# Patient Record
Sex: Female | Born: 1985 | Race: Black or African American | Hispanic: No | Marital: Single | Smoking: Current every day smoker
Health system: Southern US, Community
[De-identification: ages and names within clinical notes are randomized; demographics above are authoritative.]

## PROBLEM LIST (undated history)

## (undated) DIAGNOSIS — I10 Essential (primary) hypertension: Secondary | ICD-10-CM

---

## 2013-06-12 ENCOUNTER — Emergency Department (HOSPITAL_BASED_OUTPATIENT_CLINIC_OR_DEPARTMENT_OTHER)
Admission: EM | Admit: 2013-06-12 | Discharge: 2013-06-12 | Disposition: A | Discharge status: Home / Self Care | Payer: Self-pay | Attending: Emergency Medicine | Admitting: Emergency Medicine

## 2013-06-12 ENCOUNTER — Emergency Department (HOSPITAL_BASED_OUTPATIENT_CLINIC_OR_DEPARTMENT_OTHER): Payer: Self-pay

## 2013-06-12 ENCOUNTER — Encounter (HOSPITAL_BASED_OUTPATIENT_CLINIC_OR_DEPARTMENT_OTHER): Payer: Self-pay | Admitting: Emergency Medicine

## 2013-06-12 DIAGNOSIS — Z3202 Encounter for pregnancy test, result negative: Secondary | ICD-10-CM | POA: Insufficient documentation

## 2013-06-12 DIAGNOSIS — F17200 Nicotine dependence, unspecified, uncomplicated: Secondary | ICD-10-CM | POA: Insufficient documentation

## 2013-06-12 DIAGNOSIS — K2920 Alcoholic gastritis without bleeding: Secondary | ICD-10-CM | POA: Insufficient documentation

## 2013-06-12 LAB — PREGNANCY, URINE: Preg Test, Ur: NEGATIVE

## 2013-06-12 MED ORDER — GI COCKTAIL ~~LOC~~
30.0000 mL | Freq: Once | ORAL | Status: AC
  Administered 2013-06-12: 30 mL via ORAL
  Filled 2013-06-12: qty 30

## 2013-06-12 MED ORDER — SUCRALFATE 1 GM/10ML PO SUSP: 1.0000 g | Freq: Four times a day (QID) | ORAL | Status: DC

## 2013-06-12 NOTE — ED Notes (Signed)
Pt reports leaving [facility] this evening after being told ekg was normal and that it was going to be more than an hour, pt reports acute onset of substernal chest pain that started this evening and has not improved with baby aspirin and tums

## 2013-06-12 NOTE — ED Provider Notes (Signed)
CSN: [id number]     Arrival date & time 06/12/13  5784 History   First MD Initiated Contact with Patient 06/12/13 0256     Chief Complaint  Patient presents with  . Chest Pain   (Consider location/radiation/quality/duration/timing/severity/associated sxs/prior Treatment) Patient is a 27 y.o. female presenting with abdominal pain. The history is provided by the patient.  Abdominal Pain Pain location:  Epigastric Pain quality: burning   Pain severity:  Moderate Onset quality:  Sudden Timing:  Constant Progression:  Unchanged Context: alcohol use   Context comment:  Started while drinking several 40s Relieved by:  Nothing Worsened by:  Nothing tried Associated symptoms: no anorexia and no shortness of breath   Risk factors: not pregnant     History reviewed. No pertinent past medical history. History reviewed. No pertinent past surgical history. History reviewed. No pertinent family history. History  Substance Use Topics  . Smoking status: Current Every Day Smoker    Types: Cigarettes  . Smokeless tobacco: Not on file  . Alcohol Use: No   OB History   Grav Para Term Preterm Abortions TAB SAB Ect Mult Living                 Review of Systems  Respiratory: Negative for shortness of breath.   Gastrointestinal: Positive for abdominal pain. Negative for anorexia.  All other systems reviewed and are negative.    Allergies  Flagyl  Home Medications   Current Outpatient Rx  Name  Route  Sig  Dispense  Refill  . aspirin 325 MG EC tablet   Oral   Take 325 mg by mouth daily.         . calcium carbonate (TUMS - DOSED IN MG ELEMENTAL CALCIUM) 500 MG chewable tablet   Oral   Chew 1 tablet by mouth daily.          BP 135/90  Pulse 103  Temp(Src) 98.5 F (36.9 C) (Oral)  Resp 20  Ht 5\' 4"  (1.626 m)  Wt 196 lb 7 oz (89.103 kg)  BMI 33.7 kg/m2  LMP 05/13/2013 Physical Exam  Constitutional: She is oriented to person, place, and time. She appears well-developed  and well-nourished. No distress.  intoxicated  HENT:  Head: Normocephalic and atraumatic.  Mouth/Throat: Oropharynx is clear and moist.  Eyes: Conjunctivae are normal. Pupils are equal, round, and reactive to light.  Neck: Normal range of motion. Neck supple.  Cardiovascular: Normal rate, regular rhythm and intact distal pulses.   Pulmonary/Chest: Effort normal and breath sounds normal. She has no wheezes.  Abdominal: Soft. Bowel sounds are normal. There is no tenderness. There is no rebound and no guarding.  Musculoskeletal: Normal range of motion.  Neurological: She is alert and oriented to person, place, and time.  Skin: Skin is warm and dry.  Psychiatric: She has a normal mood and affect.    ED Course  Procedures (including critical care time) Labs Review Labs Reviewed  PREGNANCY, URINE   Imaging Review No results found.  MDM  No diagnosis found.  Date: 06/12/2013  Rate: 100  Rhythm: normal sinus rhythm  QRS Axis: normal  Intervals: normal  ST/T Wave abnormalities: normal  Conduction Disutrbances: none  Narrative Interpretation: unremarkable  PERC negative wells 0, no OCP no car trips.  Relieved with GI cocktail will treat for alcoholic gastritis ;    Jasmine Awe, MD 06/12/13 [phone]

## 2013-06-12 NOTE — Progress Notes (Signed)
Spoke with Dr Emmit Alexanders regarding patient prescription question.MD recommended patient use over the counter pepto bismol / zantac. Contacted patient back and updated her with MD instruction. Patient verbalized her understanding of over the counter medications.Patient reports she had no questions.Case manager contact number provided to patient.No further case manager needs.

## 2013-06-12 NOTE — ED Notes (Signed)
Specimen container given with instructions for urine collection

## 2013-06-12 NOTE — Progress Notes (Signed)
Incoming call received from [facility].Patient information verified in EPIC.Patient reports she received a prescription  for  sucrafate / carafate 1gm.10 ML- suspension and was asking  If a less expensive medication substitute could be called in.I explained to patient I would speak with the MD and call her back .

## 2018-01-26 ENCOUNTER — Emergency Department (HOSPITAL_BASED_OUTPATIENT_CLINIC_OR_DEPARTMENT_OTHER)
Admission: EM | Admit: 2018-01-26 | Discharge: 2018-01-26 | Disposition: A | Discharge status: Home / Self Care | Payer: Self-pay | Attending: Emergency Medicine | Admitting: Emergency Medicine

## 2018-01-26 ENCOUNTER — Other Ambulatory Visit: Payer: Self-pay

## 2018-01-26 ENCOUNTER — Encounter (HOSPITAL_BASED_OUTPATIENT_CLINIC_OR_DEPARTMENT_OTHER): Payer: Self-pay | Admitting: Emergency Medicine

## 2018-01-26 DIAGNOSIS — F17210 Nicotine dependence, cigarettes, uncomplicated: Secondary | ICD-10-CM | POA: Insufficient documentation

## 2018-01-26 DIAGNOSIS — Z7982 Long term (current) use of aspirin: Secondary | ICD-10-CM | POA: Insufficient documentation

## 2018-01-26 DIAGNOSIS — M545 Low back pain, unspecified: Secondary | ICD-10-CM

## 2018-01-26 DIAGNOSIS — Z79899 Other long term (current) drug therapy: Secondary | ICD-10-CM | POA: Insufficient documentation

## 2018-01-26 MED ORDER — CYCLOBENZAPRINE HCL 10 MG PO TABS: 10.0000 mg | ORAL_TABLET | Freq: Two times a day (BID) | ORAL | 0 refills | Status: DC | PRN

## 2018-01-26 MED FILL — CYCLOBENZAPRINE HCL 10 MG T: 10 | 10 days supply | Qty: 20 | Fill #0

## 2018-01-26 NOTE — ED Triage Notes (Signed)
Patient states that she has chronic back pain, but it is worse now and for the last 4 days

## 2018-01-26 NOTE — ED Provider Notes (Signed)
[facility] EMERGENCY DEPARTMENT Provider Note   CSN: [id number] Arrival date & time: 01/26/18  4098     History   Chief Complaint Chief Complaint  Patient presents with  . Back Pain    HPI Brandi Mendoza is a 32 y.o. female.   Back Pain   This is a chronic problem. The current episode started 1 to 2 hours ago. The problem occurs constantly. The problem has not changed since onset.The pain is associated with no known injury. Pain location: lower paraspinal. The pain does not radiate. The pain is moderate. The symptoms are aggravated by bending, twisting and certain positions. Pertinent negatives include no chest pain, no bowel incontinence, no dysuria, no pelvic pain and no paresis.    History reviewed. No pertinent past medical history.  There are no active problems to display for this patient.   History reviewed. No pertinent surgical history.   OB History   None      Home Medications    Prior to Admission medications   Medication Sig Start Date End Date Taking? Authorizing Provider  aspirin 325 MG EC tablet Take 325 mg by mouth daily.    [provider]  calcium carbonate (TUMS - DOSED IN MG ELEMENTAL CALCIUM) 500 MG chewable tablet Chew 1 tablet by mouth daily.    [provider]  cyclobenzaprine (FLEXERIL) 10 MG tablet Take 1 tablet (10 mg total) by mouth 2 (two) times daily as needed for muscle spasms. 01/26/18   Ramey Ketcherside, Barbara Cower, MD  sucralfate (CARAFATE) 1 GM/10ML suspension Take 10 mLs (1 g total) by mouth 4 (four) times daily. 06/12/13   Palumbo, April, MD    Family History History reviewed. No pertinent family history.  Social History Social History   Tobacco Use  . Smoking status: Current Every Day Smoker    Types: Cigarettes  . Smokeless tobacco: Never Used  Substance Use Topics  . Alcohol use: No  . Drug use: Never     Allergies   Shellfish allergy and Flagyl [metronidazole]   Review of Systems Review of Systems    Cardiovascular: Negative for chest pain.  Gastrointestinal: Negative for bowel incontinence.  Genitourinary: Negative for dysuria and pelvic pain.  Musculoskeletal: Positive for back pain.  All other systems reviewed and are negative.    Physical Exam Updated Vital Signs BP 139/89 (BP Location: Left Arm)   Pulse 76   Temp 98.1 F (36.7 C) (Oral)   Resp 18   Ht  (1.626 m)   Wt 80.3 kg (177 lb)   LMP 01/26/2018   SpO2 100%   BMI 30.38 kg/m   Physical Exam  Constitutional: She is oriented to person, place, and time. She appears well-developed and well-nourished.  HENT:  Head: Normocephalic and atraumatic.  Eyes: Conjunctivae and EOM are normal.  Neck: Normal range of motion.  Cardiovascular: Normal rate and regular rhythm.  Pulmonary/Chest: No stridor. No respiratory distress.  Abdominal: Soft. She exhibits no distension.  Neurological: She is alert and oriented to person, place, and time. No cranial nerve deficit. Coordination normal.  Skin: Skin is warm and dry.  Nursing note and vitals reviewed.    ED Treatments / Results  Labs (all labs ordered are listed, but only abnormal results are displayed) Labs Reviewed - No data to display  EKG None  Radiology No results found.  Procedures Procedures (including critical care time)  Medications Ordered in ED Medications - No data to display   Initial Impression / Assessment  and Plan / ED Course  I have reviewed the triage vital signs and the nursing notes.  Pertinent labs & imaging results that were available during my care of the patient were reviewed by me and considered in my medical decision making (see chart for details).   No red flags for back pain. Suspect musculoskeletal cause. Will suggest NSAIDs and muscle relaxers for treatment. Stable for dishcarge. Needs pcp follow up.  Final Clinical Impressions(s) / ED Diagnoses   Final diagnoses:  Acute right-sided low back pain without sciatica     ED Discharge Orders        Ordered    cyclobenzaprine (FLEXERIL) 10 MG tablet  2 times daily PRN     01/26/18 1121       Saafir Abdullah, Barbara Cower, MD 01/26/18 1130

## 2019-08-11 ENCOUNTER — Other Ambulatory Visit: Payer: Self-pay

## 2019-08-11 ENCOUNTER — Emergency Department (HOSPITAL_BASED_OUTPATIENT_CLINIC_OR_DEPARTMENT_OTHER)
Admission: EM | Admit: 2019-08-11 | Discharge: 2019-08-12 | Disposition: A | Discharge status: Home / Self Care | Payer: Self-pay | Attending: Emergency Medicine | Admitting: Emergency Medicine

## 2019-08-11 ENCOUNTER — Encounter (HOSPITAL_BASED_OUTPATIENT_CLINIC_OR_DEPARTMENT_OTHER): Payer: Self-pay | Admitting: *Deleted

## 2019-08-11 DIAGNOSIS — Z7982 Long term (current) use of aspirin: Secondary | ICD-10-CM | POA: Insufficient documentation

## 2019-08-11 DIAGNOSIS — Z3202 Encounter for pregnancy test, result negative: Secondary | ICD-10-CM | POA: Insufficient documentation

## 2019-08-11 DIAGNOSIS — F17210 Nicotine dependence, cigarettes, uncomplicated: Secondary | ICD-10-CM | POA: Insufficient documentation

## 2019-08-11 DIAGNOSIS — R531 Weakness: Secondary | ICD-10-CM | POA: Insufficient documentation

## 2019-08-11 DIAGNOSIS — Z79899 Other long term (current) drug therapy: Secondary | ICD-10-CM | POA: Insufficient documentation

## 2019-08-11 LAB — BASIC METABOLIC PANEL
Anion gap: 13 (ref 5–15)
BUN: 10 mg/dL (ref 6–20)
CO2: 25 mmol/L (ref 22–32)
Calcium: 9.1 mg/dL (ref 8.9–10.3)
Chloride: 99 mmol/L (ref 98–111)
Creatinine, Ser: 0.77 mg/dL (ref 0.44–1.00)
GFR calc Af Amer: >60 mL/min (ref >60)
GFR calc non Af Amer: >60 mL/min (ref >60)
Glucose, Bld: 90 mg/dL (ref 70–99)
Potassium: 4 mmol/L (ref 3.5–5.1)
Sodium: 137 mmol/L (ref 135–145)

## 2019-08-11 LAB — CBC WITH DIFFERENTIAL/PLATELET
Abs Immature Granulocytes: 0.01 10*3/uL (ref 0.00–0.07)
Basophils Absolute: 0.1 10*3/uL (ref 0.0–0.1)
Basophils Relative: 1 %
Eosinophils Absolute: 0 10*3/uL (ref 0.0–0.5)
Eosinophils Relative: 0 %
HCT: 43.7 % (ref 36.0–46.0)
Hemoglobin: 14.7 g/dL (ref 12.0–15.0)
Immature Granulocytes: 0 %
Lymphocytes Relative: 28 %
Lymphs Abs: 2.5 10*3/uL (ref 0.7–4.0)
MCH: 31.7 pg (ref 26.0–34.0)
MCHC: 33.6 g/dL (ref 30.0–36.0)
MCV: 94.4 fL (ref 80.0–100.0)
Monocytes Absolute: 0.7 10*3/uL (ref 0.1–1.0)
Monocytes Relative: 8 %
Neutro Abs: 5.6 10*3/uL (ref 1.7–7.7)
Neutrophils Relative %: 63 %
Platelets: 454 10*3/uL — ABNORMAL HIGH (ref 150–400)
RBC: 4.63 MIL/uL (ref 3.87–5.11)
RDW: 14.1 % (ref 11.5–15.5)
WBC: 8.9 10*3/uL (ref 4.0–10.5)
nRBC: 0 % (ref 0.0–0.2)

## 2019-08-11 LAB — URINALYSIS, MICROSCOPIC (REFLEX)

## 2019-08-11 LAB — URINALYSIS, ROUTINE W REFLEX MICROSCOPIC
Bilirubin Urine: NEGATIVE
Glucose, UA: NEGATIVE mg/dL
Hgb urine dipstick: NEGATIVE
Ketones, ur: NEGATIVE mg/dL
Leukocytes,Ua: NEGATIVE
Nitrite: NEGATIVE
Protein, ur: 30 mg/dL — AB
Specific Gravity, Urine: 1.025 (ref 1.005–1.030)
pH: 6 (ref 5.0–8.0)

## 2019-08-11 LAB — PREGNANCY, URINE: Preg Test, Ur: NEGATIVE

## 2019-08-11 NOTE — ED Triage Notes (Signed)
Pt presents with complaints of feeling more tired than normal she reports sleeping 18+ hours at a time. Pt states that she feels like her electrolytes are out of wack.

## 2019-08-11 NOTE — ED Triage Notes (Addendum)
Pt c/o fatigue x 6 days , seen at [facility] today with labs done. Pt admits to [facility] today

## 2019-08-12 NOTE — ED Provider Notes (Signed)
[place] [facility] Provider Note: Georgena Spurling, MD, FACEP  CSN: [id number] MRN: [id number] ARRIVAL: 08/11/19 at 2118 ROOM: [place]  08/12/19 12:56 AM Brandi Mendoza is a 33 y.o. female who complains of 5 days of increased fatigue and increased sleep.  She reports sleeping 18 hours a day.  She denies fever or other specific symptoms of a viral illness.  She denies nausea, vomiting or diarrhea.  She does admit to drinking alcohol yesterday.  Nothing seems to make the symptoms better or worse.  She has had no known COVID-19 exposure.   History reviewed. No pertinent past medical history.  History reviewed. No pertinent surgical history.  History reviewed. No pertinent family history.  Social History   Tobacco Use  . Smoking status: Current Every Day Smoker    Types: Cigarettes  . Smokeless tobacco: Never Used  Substance Use Topics  . Alcohol use: No  . Drug use: Never    Prior to Admission medications   Medication Sig Start Date End Date Taking? Authorizing Provider  amLODipine (NORVASC) 5 MG tablet Take by mouth. 07/01/19 08/30/19 Yes [provider]  aspirin 325 MG EC tablet Take 325 mg by mouth daily.    [provider]  calcium carbonate (TUMS - DOSED IN MG ELEMENTAL CALCIUM) 500 MG chewable tablet Chew 1 tablet by mouth daily.    [provider]  cyclobenzaprine (FLEXERIL) 10 MG tablet Take 1 tablet (10 mg total) by mouth 2 (two) times daily as needed for muscle spasms. 01/26/18   Mesner, Corene Cornea, MD  sucralfate (CARAFATE) 1 GM/10ML suspension Take 10 mLs (1 g total) by mouth 4 (four) times daily. 06/12/13   Palumbo, April, MD    Allergies Shellfish allergy and Flagyl [metronidazole]   REVIEW OF SYSTEMS  Negative except as noted here or in the History of Present Illness.   PHYSICAL EXAMINATION  Initial Vital Signs Blood pressure (!) 160/80, pulse 99, temperature 98.8 F (37.1  C), resp. rate 18, height 5\' 5"  (1.651 m), weight 81.6 kg, last menstrual period 07/26/2019, SpO2 100 %.  Examination General: Well-developed, well-nourished female in no acute distress; appearance consistent with age of record HENT: normocephalic; atraumatic Eyes: pupils equal, round and reactive to light; extraocular muscles intact Neck: supple Heart: regular rate and rhythm; no murmurs, rubs or gallops Lungs: clear to auscultation bilaterally Abdomen: soft; nondistended; nontender; no masses or hepatosplenomegaly; bowel sounds present Extremities: No deformity; full range of motion; pulses normal Neurologic: Awake, alert and oriented; motor function intact in all extremities and symmetric; no facial droop Skin: Warm and dry Psychiatric: Normal mood and affect   RESULTS  Summary of this visit's results, reviewed and interpreted by myself:   EKG Interpretation  Date/Time:    Ventricular Rate:    PR Interval:    QRS Duration:   QT Interval:    QTC Calculation:   R Axis:     Text Interpretation:        Laboratory Studies: Results for orders placed or performed during the hospital encounter of 08/11/19 (from the past 24 hour(s))  Urinalysis, Routine w reflex microscopic     Status: Abnormal   Collection Time: 08/11/19  9:32 PM  Result Value Ref Range   Color, Urine YELLOW YELLOW   APPearance CLEAR CLEAR   Specific Gravity, Urine 1.025 1.005 - 1.030   pH 6.0 5.0 - 8.0   Glucose, UA NEGATIVE NEGATIVE mg/dL  Hgb urine dipstick NEGATIVE NEGATIVE   Bilirubin Urine NEGATIVE NEGATIVE   Ketones, ur NEGATIVE NEGATIVE mg/dL   Protein, ur 30 (A) NEGATIVE mg/dL   Nitrite NEGATIVE NEGATIVE   Leukocytes,Ua NEGATIVE NEGATIVE  Pregnancy, urine     Status: None   Collection Time: 08/11/19  9:32 PM  Result Value Ref Range   Preg Test, Ur NEGATIVE NEGATIVE  Urinalysis, Microscopic (reflex)     Status: Abnormal   Collection Time: 08/11/19  9:32 PM  Result Value Ref Range   RBC /  HPF 0-5 0 - 5 RBC/hpf   WBC, UA 0-5 0 - 5 WBC/hpf   Bacteria, UA FEW (A) NONE SEEN   Squamous Epithelial / LPF 0-5 0 - 5  CBC with Differential     Status: Abnormal   Collection Time: 08/11/19 10:48 PM  Result Value Ref Range   WBC 8.9 4.0 - 10.5 K/uL   RBC 4.63 3.87 - 5.11 MIL/uL   Hemoglobin 14.7 12.0 - 15.0 g/dL   HCT 87.5 64.3 - 32.9 %   MCV 94.4 80.0 - 100.0 fL   MCH 31.7 26.0 - 34.0 pg   MCHC 33.6 30.0 - 36.0 g/dL   RDW 51.8 84.1 - 66.0 %   Platelets 454 (H) 150 - 400 K/uL   nRBC 0.0 0.0 - 0.2 %   Neutrophils Relative % 63 %   Neutro Abs 5.6 1.7 - 7.7 K/uL   Lymphocytes Relative 28 %   Lymphs Abs 2.5 0.7 - 4.0 K/uL   Monocytes Relative 8 %   Monocytes Absolute 0.7 0.1 - 1.0 K/uL   Eosinophils Relative 0 %   Eosinophils Absolute 0.0 0.0 - 0.5 K/uL   Basophils Relative 1 %   Basophils Absolute 0.1 0.0 - 0.1 K/uL   Immature Granulocytes 0 %   Abs Immature Granulocytes 0.01 0.00 - 0.07 K/uL  Basic metabolic panel     Status: None   Collection Time: 08/11/19 10:48 PM  Result Value Ref Range   Sodium 137 135 - 145 mmol/L   Potassium 4.0 3.5 - 5.1 mmol/L   Chloride 99 98 - 111 mmol/L   CO2 25 22 - 32 mmol/L   Glucose, Bld 90 70 - 99 mg/dL   BUN 10 6 - 20 mg/dL   Creatinine, Ser 6.30 0.44 - 1.00 mg/dL   Calcium 9.1 8.9 - 16.0 mg/dL   GFR calc non Af Amer >60 >60 mL/min   GFR calc Af Amer >60 >60 mL/min   Anion gap 13 5 - 15   Imaging Studies: No results found.  ED COURSE and MDM  Nursing notes, initial and subsequent vitals signs, including pulse oximetry, reviewed and interpreted by myself.  Vitals:   08/11/19 2127 08/11/19 2129  BP:  (!) 160/80  Pulse:  99  Resp:  18  Temp:  98.8 F (37.1 C)  SpO2:  100%  Weight: 81.6 kg   Height: 5\' 5"  (1.651 m)    Medications - No data to display  1:12 AM Patient advised of reassuring lab work.  She declines a COVID-19 test.  The cause of her fatigue is unclear at this time.  PROCEDURES  Procedures   ED  DIAGNOSES     ICD-10-CM   1. Generalized weakness  R53.1        Anastaisa Wooding, MD 08/12/19 [phone]

## 2019-08-12 NOTE — ED Notes (Signed)
PT states feels tired all the time for weeks. States had to drink alcohol to help with feeling achy earlier.

## 2021-01-22 ENCOUNTER — Emergency Department (HOSPITAL_BASED_OUTPATIENT_CLINIC_OR_DEPARTMENT_OTHER)
Admission: EM | Admit: 2021-01-22 | Discharge: 2021-01-22 | Disposition: A | Discharge status: Home / Self Care | Payer: BLUE CROSS/BLUE SHIELD | Attending: Emergency Medicine | Admitting: Emergency Medicine

## 2021-01-22 ENCOUNTER — Encounter (HOSPITAL_BASED_OUTPATIENT_CLINIC_OR_DEPARTMENT_OTHER): Payer: Self-pay | Admitting: *Deleted

## 2021-01-22 ENCOUNTER — Emergency Department (HOSPITAL_BASED_OUTPATIENT_CLINIC_OR_DEPARTMENT_OTHER): Payer: BLUE CROSS/BLUE SHIELD

## 2021-01-22 ENCOUNTER — Other Ambulatory Visit: Payer: Self-pay

## 2021-01-22 DIAGNOSIS — Z7982 Long term (current) use of aspirin: Secondary | ICD-10-CM | POA: Insufficient documentation

## 2021-01-22 DIAGNOSIS — F17210 Nicotine dependence, cigarettes, uncomplicated: Secondary | ICD-10-CM | POA: Diagnosis not present

## 2021-01-22 DIAGNOSIS — S82831A Other fracture of upper and lower end of right fibula, initial encounter for closed fracture: Secondary | ICD-10-CM | POA: Diagnosis not present

## 2021-01-22 DIAGNOSIS — S99911A Unspecified injury of right ankle, initial encounter: Secondary | ICD-10-CM | POA: Diagnosis present

## 2021-01-22 DIAGNOSIS — W108XXA Fall (on) (from) other stairs and steps, initial encounter: Secondary | ICD-10-CM | POA: Diagnosis not present

## 2021-01-22 NOTE — ED Provider Notes (Signed)
[facility] EMERGENCY DEPARTMENT Provider Note   CSN: [id number] Arrival date & time: 01/22/21  1432     History Chief Complaint  Patient presents with  . Ankle Pain    Brandi Mendoza is a 35 y.o. female presents to the ED for evaluation of right ankle pain that began 2 days ago after a fall going up steps.  Associated with swelling. She fell down but doesn't know how she twisted her foot. No pain unless she is putting weight on it or touching it.  States she has been drinking alcohol this morning and popping 800s of ibuprofen to be able to walk.  States she was not drinking 2 days ago when she fell but her friend's child was walking by her up the steps and she tripped. Denies any other injury from the fall 2 days ago. Denies distal foot tingling, numbness. No previous injuries, recent injections, redness, warmth, fevers, calf pain or swelling.   HPI     History reviewed. No pertinent past medical history.  There are no problems to display for this patient.   History reviewed. No pertinent surgical history.   OB History   No obstetric history on file.     No family history on file.  Social History   Tobacco Use  . Smoking status: Current Every Day Smoker    Types: Cigarettes  . Smokeless tobacco: Never Used  Substance Use Topics  . Alcohol use: Yes  . Drug use: Never    Home Medications Prior to Admission medications   Medication Sig Start Date End Date Taking? Authorizing Provider  amLODipine (NORVASC) 5 MG tablet Take by mouth. 07/01/19 08/30/19  [provider]  aspirin 325 MG EC tablet Take 325 mg by mouth daily.    [provider]  calcium carbonate (TUMS - DOSED IN MG ELEMENTAL CALCIUM) 500 MG chewable tablet Chew 1 tablet by mouth daily.    [provider]  sucralfate (CARAFATE) 1 GM/10ML suspension Take 10 mLs (1 g total) by mouth 4 (four) times daily. 06/12/13 08/12/19  Palumbo, April, MD    Allergies    Shellfish allergy  and Flagyl [metronidazole]  Review of Systems   Review of Systems  Musculoskeletal: Positive for arthralgias, gait problem and joint swelling.  All other systems reviewed and are negative.   Physical Exam Updated Vital Signs BP 120/90 (BP Location: Right Arm)   Pulse 89   Temp 98.6 F (37 C) (Oral)   Resp 14   Ht 5\' 5"  (1.651 m)   Wt 81.6 kg   LMP 01/08/2021   SpO2 97%   BMI 29.94 kg/m   Physical Exam Constitutional:      Appearance: She is well-developed.  HENT:     Head: Normocephalic.     Nose: Nose normal.  Eyes:     General: Lids are normal.  Cardiovascular:     Rate and Rhythm: Normal rate.     Pulses:          Dorsalis pedis pulses are 1+ on the right side.       Posterior tibial pulses are 1+ on the right side.  Pulmonary:     Effort: Pulmonary effort is normal. No respiratory distress.  Musculoskeletal:        General: Normal range of motion.     Cervical back: Normal range of motion.     Right ankle: Tenderness present over the lateral malleolus.     Comments:  Right knee: no focal  bony tenderness. Full ROM of knee without pain. No effusion. No calf edema or tenderness.  Right ankle: focal tenderness, edema at distal fibular and lateral malleolus. No focal tenderness on medial ankle, achilles, calcaneous.  Decreased inversion/eversion due to pain but patient able to evert/invert independently. Normal flexion of great toe against resistance. Lateral and calf compartment soft. Negative squeeze test.  Neurological:     Mental Status: She is alert.     Comments: Sensation to light touch medial, anterior, lateral RLE intact. Strength in ankle equal bilaterally  Psychiatric:        Behavior: Behavior normal.     ED Results / Procedures / Treatments   Labs (all labs ordered are listed, but only abnormal results are displayed) Labs Reviewed - No data to display  EKG None  Radiology DG Ankle Complete Right  Result Date: 01/22/2021 CLINICAL DATA:  Fall 2  days ago. Right lateral ankle pain and swelling. EXAM: RIGHT ANKLE - COMPLETE 3+ VIEW COMPARISON:  None. FINDINGS: Minimally displaced distal fibular fracture is present. Ankle mortise is intact. Tibia unremarkable. No additional fractures present. Lateral soft tissue swelling is noted. IMPRESSION: Minimally displaced distal fibular fracture with associated soft tissue swelling. Electronically Signed   By: Marin Roberts M.D.   On: 01/22/2021 15:23    Procedures Procedures   Medications Ordered in ED Medications - No data to display  ED Course  I have reviewed the triage vital signs and the nursing notes.  Pertinent labs & imaging results that were available during my care of the patient were reviewed by me and considered in my medical decision making (see chart for details).    MDM Rules/Calculators/A&P                          35 year old female presents to the ED for lateral ankle pain after a fall 2 days ago.  Exam reveals focal tenderness and edema over the lateral malleolus and distal fibular, pain with inversion, eversion.  X-ray obtained in triage personally reviewed and interpreted and confirms distal fibular fracture.  Compartments of the leg are soft.  Pulses intact.  Sensation and strength intact as well.  Some decreased inversion and eversion due to pain but only mild.  No exam findings to suggest medial ligament ankle injury, high ankle injury, syndesmosis injury, compartment syndrome.  Will place patient in a splint, crutches.  We will discharged with high-dose NSAIDs, ice.  Recommend follow-up with orthopedic clinic for reevaluation and long-term care.  Return precautions discussed.  Patient is comfortable with this plan.  Final Clinical Impression(s) / ED Diagnoses Final diagnoses:  Other closed fracture of distal end of right fibula, initial encounter    Rx / DC Orders ED Discharge Orders    None       Liberty Handy, PA-C 01/22/21 1634    Little, Ambrose Finland, MD 01/23/21 [phone]

## 2021-01-22 NOTE — Discharge Instructions (Signed)
You of a fracture of your fibula.  This is the skinny, long bone on the lower part of your leg.  Wear your leg splint until you are evaluated by the orthopedic doctor.  Call Dr. Jordan Likes and make an appointment for reevaluation.  Use your crutches to avoid putting weight on your foot.  Alternate ibuprofen and acetaminophen as needed for pain.  Elevate your foot.  Return to the ED for purple discoloration, coolness, tingling, numbness of your foot  For pain and inflammation you can use a combination of ibuprofen and acetaminophen.  Take [phone] mg acetaminophen (tylenol) every 6 hours or 600 mg ibuprofen (advil, motrin) every 6 hours.  You can take these separately or combine them every 6 hours for maximum pain control. Do not exceed 4,000 mg acetaminophen or 2,400 mg ibuprofen in a 24 hour period.  Do not take ibuprofen containing products if you have history of kidney disease, ulcers, GI bleeding, severe acid reflux, or take a blood thinner.  Do not take acetaminophen if you have liver disease.

## 2021-01-22 NOTE — ED Triage Notes (Signed)
Right ankle injury x 2 days. Larey Seat going up steps. Her gait is staggered. She admits to drinking alcohol today.

## 2021-01-22 NOTE — ED Notes (Signed)
Pt. Will be discharged as soon as her splint and crutch teaching is done.

## 2021-01-24 ENCOUNTER — Other Ambulatory Visit: Payer: Self-pay

## 2021-01-24 ENCOUNTER — Ambulatory Visit (INDEPENDENT_AMBULATORY_CARE_PROVIDER_SITE_OTHER): Payer: BLUE CROSS/BLUE SHIELD | Admitting: Family Medicine

## 2021-01-24 VITALS — Ht 65.0 in | Wt 181.0 lb

## 2021-01-24 DIAGNOSIS — S8261XA Displaced fracture of lateral malleolus of right fibula, initial encounter for closed fracture: Secondary | ICD-10-CM | POA: Diagnosis not present

## 2021-01-24 NOTE — Assessment & Plan Note (Signed)
Initial injury on 4/30.  Mildly displaced lateral malleolus fractures.  Has good joint preservation. -Counseled on supportive care. -Cam walker. -Counseled on crutches and scooter. -Provided work note. -Follow-up in 1 week to reimage.

## 2021-01-24 NOTE — Patient Instructions (Signed)
Nice to meet you Please use ice as needed  Please use the scooter or crutches with the CAM walker   Please send me a message in MyChart with any questions or updates.  Please see me back in 1 week.   --Dr. Jordan Likes

## 2021-01-24 NOTE — Progress Notes (Signed)
  Brandi Mendoza - 35 y.o. female MRN [id number]  Date of birth: 01-11-1986  SUBJECTIVE:  Including CC & ROS.  No chief complaint on file.   Brandi Mendoza is a 35 y.o. female that is presenting with right ankle pain.  She had a fall a few days ago down the stairs.  She was diagnosed with a fracture of the ankle and placed in a splint.  Denies any significant pain today.  No history of similar injury..  Independent review of the right ankle x-ray from 5/2 shows minimally displaced distal fibular fracture   Review of Systems See HPI   HISTORY: Past Medical, Surgical, Social, and Family History Reviewed & Updated per EMR.   Pertinent Historical Findings include:  No past medical history on file.  No past surgical history on file.  No family history on file.  Social History   Socioeconomic History  . Marital status: Single    Spouse name: Not on file  . Number of children: Not on file  . Years of education: Not on file  . Highest education level: Not on file  Occupational History  . Not on file  Tobacco Use  . Smoking status: Current Every Day Smoker    Types: Cigarettes  . Smokeless tobacco: Never Used  Substance and Sexual Activity  . Alcohol use: Yes  . Drug use: Never  . Sexual activity: Not on file  Other Topics Concern  . Not on file  Social History Narrative  . Not on file   Social Determinants of Health   Financial Resource Strain: Not on file  Food Insecurity: Not on file  Transportation Needs: Not on file  Physical Activity: Not on file  Stress: Not on file  Social Connections: Not on file  Intimate Partner Violence: Not on file     PHYSICAL EXAM:  VS: Ht 5\' 5"  (1.651 m)   Wt 181 lb (82.1 kg)   LMP 01/08/2021   BMI 30.12 kg/m  Physical Exam Gen: NAD, alert, cooperative with exam, well-appearing MSK:  Right ankle: Tenderness to palpation over the lateral malleolus. No significant swelling or ecchymosis. Limited range of motion. Neurovascular  intact     ASSESSMENT & PLAN:   Closed displaced fracture of lateral malleolus of right fibula Initial injury on 4/30.  Mildly displaced lateral malleolus fractures.  Has good joint preservation. -Counseled on supportive care. -Cam walker. -Counseled on crutches and scooter. -Provided work note. -Follow-up in 1 week to reimage.

## 2021-01-31 ENCOUNTER — Encounter: Payer: Self-pay | Admitting: Family Medicine

## 2021-01-31 ENCOUNTER — Other Ambulatory Visit: Payer: Self-pay

## 2021-01-31 ENCOUNTER — Ambulatory Visit (HOSPITAL_BASED_OUTPATIENT_CLINIC_OR_DEPARTMENT_OTHER)
Admission: RE | Admit: 2021-01-31 | Discharge: 2021-01-31 | Disposition: A | Discharge status: Home / Self Care | Payer: BLUE CROSS/BLUE SHIELD | Source: Ambulatory Visit | Attending: Family Medicine | Admitting: Family Medicine

## 2021-01-31 ENCOUNTER — Ambulatory Visit (INDEPENDENT_AMBULATORY_CARE_PROVIDER_SITE_OTHER): Payer: BLUE CROSS/BLUE SHIELD | Admitting: Family Medicine

## 2021-01-31 DIAGNOSIS — S8261XD Displaced fracture of lateral malleolus of right fibula, subsequent encounter for closed fracture with routine healing: Secondary | ICD-10-CM | POA: Diagnosis not present

## 2021-01-31 NOTE — Assessment & Plan Note (Addendum)
Initial injury occurred 4/30.  Continues to get improvement. -Counseled on home exercise therapy and supportive care. -X-ray. -Continue cam walker -Follow-up in 2 weeks.

## 2021-01-31 NOTE — Patient Instructions (Signed)
Good to see you Please continue the boot  Please try ice as needed I will call with the results from today   Please send me a message in MyChart with any questions or updates.  Please see me back in 2 weeks.   --Dr. Jordan Likes

## 2021-01-31 NOTE — Progress Notes (Signed)
  Brandi Mendoza - 35 y.o. female MRN [id number]  Date of birth: 07-15-1986  SUBJECTIVE:  Including CC & ROS.  No chief complaint on file.   Brandi Mendoza is a 35 y.o. female that is following up for her right ankle fracture.  The swelling and pain have improved with using the cam walker.  No numbness or tingling.   Review of Systems See HPI   HISTORY: Past Medical, Surgical, Social, and Family History Reviewed & Updated per EMR.   Pertinent Historical Findings include:  History reviewed. No pertinent past medical history.  History reviewed. No pertinent surgical history.  History reviewed. No pertinent family history.  Social History   Socioeconomic History  . Marital status: Single    Spouse name: Not on file  . Number of children: Not on file  . Years of education: Not on file  . Highest education level: Not on file  Occupational History  . Not on file  Tobacco Use  . Smoking status: Current Every Day Smoker    Types: Cigarettes  . Smokeless tobacco: Never Used  Substance and Sexual Activity  . Alcohol use: Yes  . Drug use: Never  . Sexual activity: Not on file  Other Topics Concern  . Not on file  Social History Narrative  . Not on file   Social Determinants of Health   Financial Resource Strain: Not on file  Food Insecurity: Not on file  Transportation Needs: Not on file  Physical Activity: Not on file  Stress: Not on file  Social Connections: Not on file  Intimate Partner Violence: Not on file     PHYSICAL EXAM:  VS: BP 140/82 (BP Location: Left Arm, Patient Position: Sitting, Cuff Size: Large)   Ht 5\' 5"  (1.651 m)   Wt 179 lb (81.2 kg)   LMP 01/08/2021   BMI 29.79 kg/m  Physical Exam Gen: NAD, alert, cooperative with exam, well-appearing MSK:  Right ankle: Limited range of motion. Tenderness palpation of the distal fibula. No significant swelling or ecchymosis. Neurovascular intact     ASSESSMENT & PLAN:   Closed displaced fracture of  lateral malleolus of right fibula Initial injury occurred 4/30.  Continues to get improvement. -Counseled on home exercise therapy and supportive care. -X-ray. -Continue cam walker -Follow-up in 2 weeks.

## 2021-02-02 ENCOUNTER — Telehealth: Payer: Self-pay | Admitting: Family Medicine

## 2021-02-02 NOTE — Telephone Encounter (Signed)
Unable to leave VM for patient. If she calls back please have her speak with a nurse/CMA and inform that her fracture is stable. We'll continue current plan.   If any questions then please take the best time and phone number to call and I will try to call her back.   Myra Rude, MD [facility] 02/02/2021, 10:40 AM

## 2021-02-05 NOTE — Telephone Encounter (Signed)
Pt informed of below.  

## 2021-02-14 ENCOUNTER — Other Ambulatory Visit: Payer: Self-pay

## 2021-02-14 ENCOUNTER — Encounter: Payer: Self-pay | Admitting: Family Medicine

## 2021-02-14 ENCOUNTER — Ambulatory Visit (INDEPENDENT_AMBULATORY_CARE_PROVIDER_SITE_OTHER): Payer: BLUE CROSS/BLUE SHIELD | Admitting: Family Medicine

## 2021-02-14 DIAGNOSIS — S8261XD Displaced fracture of lateral malleolus of right fibula, subsequent encounter for closed fracture with routine healing: Secondary | ICD-10-CM

## 2021-02-14 NOTE — Patient Instructions (Signed)
Good to see you Please use ice  Please continue the range of motion movements.   Please send me a message in MyChart with any questions or updates.  Please see me back in 2 weeks.   --Dr. Jordan Likes

## 2021-02-14 NOTE — Progress Notes (Signed)
  Brandi Mendoza - 35 y.o. female MRN [id number]  Date of birth: 03-Aug-1986  SUBJECTIVE:  Including CC & ROS.  No chief complaint on file.   Brandi Mendoza is a 35 y.o. female that is following up for the right ankle fracture.  Having some swelling today.  Having some cramping in the posterior aspect of the ankle as well.   Review of Systems See HPI   HISTORY: Past Medical, Surgical, Social, and Family History Reviewed & Updated per EMR.   Pertinent Historical Findings include:  History reviewed. No pertinent past medical history.  History reviewed. No pertinent surgical history.  History reviewed. No pertinent family history.  Social History   Socioeconomic History  . Marital status: Single    Spouse name: Not on file  . Number of children: Not on file  . Years of education: Not on file  . Highest education level: Not on file  Occupational History  . Not on file  Tobacco Use  . Smoking status: Current Every Day Smoker    Types: Cigarettes  . Smokeless tobacco: Never Used  Substance and Sexual Activity  . Alcohol use: Yes  . Drug use: Never  . Sexual activity: Not on file  Other Topics Concern  . Not on file  Social History Narrative  . Not on file   Social Determinants of Health   Financial Resource Strain: Not on file  Food Insecurity: Not on file  Transportation Needs: Not on file  Physical Activity: Not on file  Stress: Not on file  Social Connections: Not on file  Intimate Partner Violence: Not on file     PHYSICAL EXAM:  VS: BP 108/70 (BP Location: Left Arm, Patient Position: Sitting, Cuff Size: Normal)   Ht 5\' 5"  (1.651 m)   Wt 179 lb (81.2 kg)   BMI 29.79 kg/m  Physical Exam Gen: NAD, alert, cooperative with exam, well-appearing MSK:  Right ankle: Tenderness palpation over the lateral malleolus. Swelling occurring around the ankle joint. Limited range of motion. Neurovascular intact     ASSESSMENT & PLAN:   Closed displaced fracture of  lateral malleolus of right fibula Initial injury on 4/30.  Having some swelling and cramping today.  Has been doing range of motion exercises. -Counseled on home exercise therapy and supportive care. -Provided heel lift and cushioning for inside of the cam walker. - provided work note. -Follow-up in 2 weeks and reimage

## 2021-02-14 NOTE — Assessment & Plan Note (Signed)
Initial injury on 4/30.  Having some swelling and cramping today.  Has been doing range of motion exercises. -Counseled on home exercise therapy and supportive care. -Provided heel lift and cushioning for inside of the cam walker. - provided work note. -Follow-up in 2 weeks and reimage
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
## 2022-03-24 ENCOUNTER — Encounter (HOSPITAL_BASED_OUTPATIENT_CLINIC_OR_DEPARTMENT_OTHER): Payer: Self-pay | Admitting: Emergency Medicine

## 2022-03-24 ENCOUNTER — Other Ambulatory Visit: Payer: Self-pay

## 2022-03-24 ENCOUNTER — Emergency Department (HOSPITAL_BASED_OUTPATIENT_CLINIC_OR_DEPARTMENT_OTHER)
Admission: EM | Admit: 2022-03-24 | Discharge: 2022-03-24 | Disposition: A | Discharge status: Home / Self Care | Payer: BLUE CROSS/BLUE SHIELD | Attending: Emergency Medicine | Admitting: Emergency Medicine

## 2022-03-24 ENCOUNTER — Emergency Department (HOSPITAL_BASED_OUTPATIENT_CLINIC_OR_DEPARTMENT_OTHER): Payer: BLUE CROSS/BLUE SHIELD

## 2022-03-24 DIAGNOSIS — Z79899 Other long term (current) drug therapy: Secondary | ICD-10-CM | POA: Insufficient documentation

## 2022-03-24 DIAGNOSIS — Z7982 Long term (current) use of aspirin: Secondary | ICD-10-CM | POA: Diagnosis not present

## 2022-03-24 DIAGNOSIS — R519 Headache, unspecified: Secondary | ICD-10-CM | POA: Diagnosis present

## 2022-03-24 DIAGNOSIS — I10 Essential (primary) hypertension: Secondary | ICD-10-CM | POA: Insufficient documentation

## 2022-03-24 HISTORY — DX: Essential (primary) hypertension: I10

## 2022-03-24 LAB — BASIC METABOLIC PANEL
Anion gap: 8 (ref 5–15)
BUN: 8 mg/dL (ref 6–20)
CO2: 22 mmol/L (ref 22–32)
Calcium: 9.9 mg/dL (ref 8.9–10.3)
Chloride: 109 mmol/L (ref 98–111)
Creatinine, Ser: 0.76 mg/dL (ref 0.44–1.00)
GFR, Estimated: >60 mL/min (ref >60)
Glucose, Bld: 113 mg/dL — ABNORMAL HIGH (ref 70–99)
Potassium: 3.5 mmol/L (ref 3.5–5.1)
Sodium: 139 mmol/L (ref 135–145)

## 2022-03-24 LAB — CBC
HCT: 43 % (ref 36.0–46.0)
Hemoglobin: 15 g/dL (ref 12.0–15.0)
MCH: 33.2 pg (ref 26.0–34.0)
MCHC: 34.9 g/dL (ref 30.0–36.0)
MCV: 95.1 fL (ref 80.0–100.0)
Platelets: 353 10*3/uL (ref 150–400)
RBC: 4.52 MIL/uL (ref 3.87–5.11)
RDW: 12.2 % (ref 11.5–15.5)
WBC: 5.6 10*3/uL (ref 4.0–10.5)
nRBC: 0 % (ref 0.0–0.2)

## 2022-03-24 LAB — TROPONIN I (HIGH SENSITIVITY): Troponin I (High Sensitivity): <2 ng/L (ref <18)

## 2022-03-24 NOTE — ED Triage Notes (Signed)
Pt arrives pov, steady gait, c/o hypertension and "feel lightheaded". Pt also endorses HA, "head tingling" since yesterday. AOx4, bilaterally equal
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
## 2023-01-06 ENCOUNTER — Encounter: Payer: Self-pay | Admitting: *Deleted

## 2023-04-07 ENCOUNTER — Encounter (HOSPITAL_BASED_OUTPATIENT_CLINIC_OR_DEPARTMENT_OTHER): Payer: Self-pay | Admitting: Emergency Medicine

## 2023-04-07 ENCOUNTER — Emergency Department (HOSPITAL_BASED_OUTPATIENT_CLINIC_OR_DEPARTMENT_OTHER)
Admission: EM | Admit: 2023-04-07 | Discharge: 2023-04-07 | Disposition: A | Discharge status: Home / Self Care | Payer: BLUE CROSS/BLUE SHIELD | Source: Home / Self Care | Attending: Emergency Medicine | Admitting: Emergency Medicine

## 2023-04-07 ENCOUNTER — Other Ambulatory Visit: Payer: Self-pay

## 2023-04-07 ENCOUNTER — Ambulatory Visit (HOSPITAL_COMMUNITY)
Admission: EM | Admit: 2023-04-07 | Discharge: 2023-04-07 | Disposition: A | Discharge status: Home / Self Care | Payer: No Typology Code available for payment source | Source: Ambulatory Visit | Attending: Emergency Medicine | Admitting: Emergency Medicine

## 2023-04-07 DIAGNOSIS — Z0441 Encounter for examination and observation following alleged adult rape: Secondary | ICD-10-CM | POA: Insufficient documentation

## 2023-04-07 DIAGNOSIS — Z79899 Other long term (current) drug therapy: Secondary | ICD-10-CM | POA: Insufficient documentation

## 2023-04-07 DIAGNOSIS — T7421XA Adult sexual abuse, confirmed, initial encounter: Secondary | ICD-10-CM | POA: Diagnosis not present

## 2023-04-07 DIAGNOSIS — Z7982 Long term (current) use of aspirin: Secondary | ICD-10-CM | POA: Insufficient documentation
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
## 2023-10-16 ENCOUNTER — Encounter (HOSPITAL_BASED_OUTPATIENT_CLINIC_OR_DEPARTMENT_OTHER): Payer: Self-pay | Admitting: *Deleted

## 2023-10-16 ENCOUNTER — Emergency Department (HOSPITAL_BASED_OUTPATIENT_CLINIC_OR_DEPARTMENT_OTHER)
Admission: EM | Admit: 2023-10-16 | Discharge: 2023-10-16 | Discharge status: Against Medical Advice | Payer: Medicaid Other | Attending: Emergency Medicine | Admitting: Emergency Medicine

## 2023-10-16 ENCOUNTER — Other Ambulatory Visit: Payer: Self-pay

## 2023-10-16 DIAGNOSIS — Z5321 Procedure and treatment not carried out due to patient leaving prior to being seen by health care provider: Secondary | ICD-10-CM | POA: Insufficient documentation

## 2023-10-16 DIAGNOSIS — M549 Dorsalgia, unspecified: Secondary | ICD-10-CM | POA: Diagnosis present
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
## 2023-10-16 NOTE — ED Notes (Signed)
This RN called for pt to treatment room for lobby, no answer, record indicates this is 3x call

## 2023-10-16 NOTE — ED Triage Notes (Signed)
Pt is here for back pain.  Pt states that she feels she needs a cortisone shot.  Pt states that the pain has been going on for months and not associated with any injury
